# Patient Record
Sex: Female | Born: 1970 | Race: White | Hispanic: No | Marital: Married
Health system: Southern US, Academic
[De-identification: ages and names within clinical notes are randomized; demographics above are authoritative.]

---
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
## 2022-10-31 ENCOUNTER — Other Ambulatory Visit (HOSPITAL_COMMUNITY): Payer: Self-pay

## 2022-10-31 DIAGNOSIS — H8110 Benign paroxysmal vertigo, unspecified ear: Secondary | ICD-10-CM

## 2022-12-06 ENCOUNTER — Other Ambulatory Visit: Payer: Self-pay

## 2022-12-06 ENCOUNTER — Ambulatory Visit (HOSPITAL_COMMUNITY)
Admission: RE | Admit: 2022-12-06 | Discharge: 2022-12-06 | Disposition: A | Discharge status: Still In Hospital | Payer: Managed Care, Other (non HMO) | Source: Ambulatory Visit

## 2022-12-06 DIAGNOSIS — H8110 Benign paroxysmal vertigo, unspecified ear: Secondary | ICD-10-CM | POA: Insufficient documentation
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
## 2022-12-11 ENCOUNTER — Encounter (HOSPITAL_COMMUNITY): Payer: Self-pay

## 2022-12-12 ENCOUNTER — Encounter (HOSPITAL_COMMUNITY): Payer: Self-pay

## 2022-12-18 ENCOUNTER — Other Ambulatory Visit: Payer: Self-pay

## 2022-12-18 ENCOUNTER — Ambulatory Visit
Admission: RE | Admit: 2022-12-18 | Discharge: 2022-12-18 | Disposition: A | Discharge status: Still In Hospital | Payer: Managed Care, Other (non HMO) | Source: Ambulatory Visit

## 2022-12-18 NOTE — PT Treatment (Signed)
[address] [place], [place], [address]  (Office901 [phone]   (Fax) [phone]    Outpatient Rehabilitation Services  Physical Therapy Visit         Date: 12/18/2022  Patient Name: Rasheena Talmadge  Date of Birth: October 26, 1970  MRN: [id number]  Payor: Payor: Monia Pouch / Plan: Monia Pouch NOT MANAGED CARE / Product Type: Non Managed Care /   Referring Physician: Alan Mulder, MD   Diagnosis: BPPV/ Vertigo      Previous Instructions: Avoid  left   sidelying and bending over x 48 hours.    Visual Analogue Scale (ave. and high)  Cervical 0  Thoracic 0  UE 0  Head 0    Intensity no pain  Location no pain  Frequency no pain    % or #/day none  Duration none  Common Causes (2) none-pt reports less frequency and intensity of headaches, 3 days no headaches,   Location: none      Home Exercise Program (PREP) Avoid  left sidelying and bending over x 48 hours.  Effect (1) (on distal Sx.) decreased dizziness, increased cervical AROm and balance    Number of Sessions/day (home) none  Number of Sessions/day (work) none      Neuromuscular Re-education: Special Tests:( 15  minutes)  (-) Smooth Pursuit  (-) Vestibular Ocular Reflex Slow  (+) Vestibular Ocular Reflex with Gait (-) Postural Sway  (-) Romberg x4 (30 sec) no Loss of Balance E/C  (-) Cervicogenic Dizziness   (+) Hallpike/supine roll test: left --> 3 1/2 beats (+) Nystagmus < 03 Seconds Spin/Motion  (+) BPPV Left ear : Horizontal canal  (-) Cervicalgia     Treatment:  Canalith Repositioning Maneuver: (+)  ( 15  minutes)  SaO2 99%, HR 71, BPM ,BP (sit) 149/89, Head position behind acromion 3/8-1/2"  Right rotation (101) degrees, Left rotation (103) degrees  Flexion (90) degrees , Extension (82) degrees  > 80% resolved left Horizontal canal BPPV   PT s/p left Horizontal canal Canalith Repositioning maneuver x1        Results:Cervical AROM: Head Position: 1/2" Behind Acromion  Right Rotation (105) degrees  Left Rotation (105) degrees  Flexion (94) degrees  Extension (89)  degrees    Home Instructions:   Avoid left sidelying and bending over x 48 hours  Monitor cervical AROM , headaches, and balance  Total treatment time: ( 30  minutes)  Units:2    Plan: will follow until 100% resolved left Horizontal BPPV, cervical AROM > WNL and good dynamic balance    The risks/benefits of therapy have been discussed with the patient and he/she is in agreement with the established plan of care    Weston Anna, PT  12/18/2022 07:18

## 2022-12-19 ENCOUNTER — Ambulatory Visit (HOSPITAL_COMMUNITY): Payer: Self-pay

## 2022-12-19 ENCOUNTER — Encounter (HOSPITAL_COMMUNITY): Payer: Self-pay

## 2022-12-25 ENCOUNTER — Ambulatory Visit
Admission: RE | Admit: 2022-12-25 | Discharge: 2022-12-25 | Disposition: A | Discharge status: Still In Hospital | Payer: Managed Care, Other (non HMO) | Source: Ambulatory Visit

## 2022-12-25 ENCOUNTER — Other Ambulatory Visit: Payer: Self-pay

## 2022-12-25 NOTE — PT Treatment (Signed)
[address] [place], [place], [address]  ([phone]   (Fax) [phone]    Outpatient Rehabilitation Services  Physical Therapy Visit         Date: 12/25/2022  Patient Name: Shelly Clarke  Date of Birth: [birth year]  MRN: [id number]  Payor: Payor: Monia Pouch / Plan: Monia Pouch NOT MANAGED CARE / Product Type: Non Managed Care /   Referring Physician: Alan Mulder, MD   Diagnosis: BPPV/ Vertigo      Previous Instructions: Avoid  left   sidelying and bending over x 48 hours.    Visual Analogue Scale (ave. and high)  Cervical 0  Thoracic 0  UE 0  Head 0    Intensity no pain  Location no pain  Frequency no pain    % or #/day none  Duration none  Common Causes (2) none- pt reports no headaches x 1 week, no neck pain   Location: none      Home Exercise Program (PREP) Avoid  left sidelying and bending over x 48 hours.  Effect (1) (on distal Sx.) decreased dizziness, increased cervical AROM and balance    Number of Sessions/day (home) none  Number of Sessions/day (work) none      Neuromuscular Re-education: Special Tests:(  15 minutes)  (-) Smooth Pursuit  (-) Vestibular Ocular Reflex Slow  (+) Vestibular Ocular Reflex with Gait (-) Postural Sway  (-) Romberg x4 (30 sec) no Loss of Balance E/C  (-) Cervicogenic Dizziness   (+) Hallpike/ supine roll test: left (+) Nystagmus  < 0.50 Seconds Spin/Motion  (+) BPPV Left ear : Horizontal canal  (-) Cervicalgia     Treatment:  Canalith Repositioning Maneuver: (+)  ( 15  minutes)  SaO2 98% HR 76 BPM ,BP (sit) 134/78 , Head position behind acromion 3/8-1/2"  Right rotation (104) degrees, Left rotation (105) degrees  Flexion (95) degrees , Extension (88) degrees  > 99% resolved left Horizontal canal BPPV   PT s/p left Horizontal canal Canalith Repositioning maneuver x1        Results:Cervical AROM: Head Position: 1/2" Behind Acromion  Right Rotation (105) degrees  Left Rotation (105) degrees  Flexion (95) degrees  Extension (90) degrees    Home Instructions:   Avoid left sidelying  and bending over x 48 hours  Monitor cervical AROM, headaches and balance  Total treatment time: ( 30  minutes)  Units:2    Plan: will follow to ensure 100 % resolved left horizontal BPPV, cervical AROM > WNL and good dynamic balance    The risks/benefits of therapy have been discussed with the patient and he/she is in agreement with the established plan of care    Weston Anna, PT  12/25/2022 12:48

## 2022-12-26 ENCOUNTER — Encounter (HOSPITAL_COMMUNITY): Payer: Self-pay
